# Patient Record
Sex: Female | Born: 1960 | Race: White | Hispanic: No | Marital: Single | Smoking: Never smoker
Health system: Southern US, Community
[De-identification: ages and names within clinical notes are randomized; demographics above are authoritative.]

## PROBLEM LIST (undated history)

## (undated) DIAGNOSIS — G40909 Epilepsy, unspecified, not intractable, without status epilepticus: Secondary | ICD-10-CM

## (undated) DIAGNOSIS — K5792 Diverticulitis of intestine, part unspecified, without perforation or abscess without bleeding: Secondary | ICD-10-CM

## (undated) DIAGNOSIS — R197 Diarrhea, unspecified: Secondary | ICD-10-CM

## (undated) DIAGNOSIS — E119 Type 2 diabetes mellitus without complications: Secondary | ICD-10-CM

## (undated) DIAGNOSIS — R1310 Dysphagia, unspecified: Secondary | ICD-10-CM

## (undated) DIAGNOSIS — F319 Bipolar disorder, unspecified: Secondary | ICD-10-CM

## (undated) DIAGNOSIS — K529 Noninfective gastroenteritis and colitis, unspecified: Secondary | ICD-10-CM

## (undated) DIAGNOSIS — K219 Gastro-esophageal reflux disease without esophagitis: Secondary | ICD-10-CM

## (undated) DIAGNOSIS — I10 Essential (primary) hypertension: Secondary | ICD-10-CM

## (undated) DIAGNOSIS — F919 Conduct disorder, unspecified: Secondary | ICD-10-CM

## (undated) DIAGNOSIS — F79 Unspecified intellectual disabilities: Secondary | ICD-10-CM

---

## 2019-08-05 ENCOUNTER — Emergency Department (HOSPITAL_COMMUNITY)
Admission: EM | Admit: 2019-08-05 | Discharge: 2019-08-05 | Disposition: A | Discharge status: Home / Self Care | Payer: Medicare Other | Attending: Emergency Medicine | Admitting: Emergency Medicine

## 2019-08-05 ENCOUNTER — Encounter (HOSPITAL_COMMUNITY): Payer: Self-pay | Admitting: Emergency Medicine

## 2019-08-05 ENCOUNTER — Emergency Department (HOSPITAL_COMMUNITY): Payer: Medicare Other

## 2019-08-05 ENCOUNTER — Other Ambulatory Visit: Payer: Self-pay

## 2019-08-05 DIAGNOSIS — F79 Unspecified intellectual disabilities: Secondary | ICD-10-CM | POA: Insufficient documentation

## 2019-08-05 DIAGNOSIS — Y92199 Unspecified place in other specified residential institution as the place of occurrence of the external cause: Secondary | ICD-10-CM | POA: Diagnosis not present

## 2019-08-05 DIAGNOSIS — I10 Essential (primary) hypertension: Secondary | ICD-10-CM | POA: Diagnosis not present

## 2019-08-05 DIAGNOSIS — W19XXXA Unspecified fall, initial encounter: Secondary | ICD-10-CM

## 2019-08-05 DIAGNOSIS — Y999 Unspecified external cause status: Secondary | ICD-10-CM | POA: Insufficient documentation

## 2019-08-05 DIAGNOSIS — S7001XA Contusion of right hip, initial encounter: Secondary | ICD-10-CM | POA: Insufficient documentation

## 2019-08-05 DIAGNOSIS — S79911A Unspecified injury of right hip, initial encounter: Secondary | ICD-10-CM | POA: Diagnosis present

## 2019-08-05 DIAGNOSIS — Z8669 Personal history of other diseases of the nervous system and sense organs: Secondary | ICD-10-CM | POA: Insufficient documentation

## 2019-08-05 DIAGNOSIS — W1830XA Fall on same level, unspecified, initial encounter: Secondary | ICD-10-CM | POA: Insufficient documentation

## 2019-08-05 DIAGNOSIS — F319 Bipolar disorder, unspecified: Secondary | ICD-10-CM | POA: Insufficient documentation

## 2019-08-05 DIAGNOSIS — E119 Type 2 diabetes mellitus without complications: Secondary | ICD-10-CM | POA: Insufficient documentation

## 2019-08-05 DIAGNOSIS — R2689 Other abnormalities of gait and mobility: Secondary | ICD-10-CM | POA: Insufficient documentation

## 2019-08-05 DIAGNOSIS — Y939 Activity, unspecified: Secondary | ICD-10-CM | POA: Insufficient documentation

## 2019-08-05 HISTORY — DX: Unspecified intellectual disabilities: F79

## 2019-08-05 HISTORY — DX: Noninfective gastroenteritis and colitis, unspecified: K52.9

## 2019-08-05 HISTORY — DX: Conduct disorder, unspecified: F91.9

## 2019-08-05 HISTORY — DX: Diverticulitis of intestine, part unspecified, without perforation or abscess without bleeding: K57.92

## 2019-08-05 HISTORY — DX: Type 2 diabetes mellitus without complications: E11.9

## 2019-08-05 HISTORY — DX: Essential (primary) hypertension: I10

## 2019-08-05 HISTORY — DX: Epilepsy, unspecified, not intractable, without status epilepticus: G40.909

## 2019-08-05 HISTORY — DX: Bipolar disorder, unspecified: F31.9

## 2019-08-05 HISTORY — DX: Dysphagia, unspecified: R13.10

## 2019-08-05 HISTORY — DX: Gastro-esophageal reflux disease without esophagitis: K21.9

## 2019-08-05 HISTORY — DX: Diarrhea, unspecified: R19.7

## 2019-08-05 LAB — CBC WITH DIFFERENTIAL/PLATELET
Abs Immature Granulocytes: 0.02 10*3/uL (ref 0.00–0.07)
Basophils Absolute: 0 10*3/uL (ref 0.0–0.1)
Basophils Relative: 1 %
Eosinophils Absolute: 0 10*3/uL (ref 0.0–0.5)
Eosinophils Relative: 0 %
HCT: 35.6 % — ABNORMAL LOW (ref 36.0–46.0)
Hemoglobin: 11.9 g/dL — ABNORMAL LOW (ref 12.0–15.0)
Immature Granulocytes: 1 %
Lymphocytes Relative: 31 %
Lymphs Abs: 1.4 10*3/uL (ref 0.7–4.0)
MCH: 33.2 pg (ref 26.0–34.0)
MCHC: 33.4 g/dL (ref 30.0–36.0)
MCV: 99.4 fL (ref 80.0–100.0)
Monocytes Absolute: 0.5 10*3/uL (ref 0.1–1.0)
Monocytes Relative: 11 %
Neutro Abs: 2.5 10*3/uL (ref 1.7–7.7)
Neutrophils Relative %: 56 %
Platelets: 192 10*3/uL (ref 150–400)
RBC: 3.58 MIL/uL — ABNORMAL LOW (ref 3.87–5.11)
RDW: 14.9 % (ref 11.5–15.5)
WBC: 4.3 10*3/uL (ref 4.0–10.5)
nRBC: 0 % (ref 0.0–0.2)

## 2019-08-05 LAB — BASIC METABOLIC PANEL
Anion gap: 9 (ref 5–15)
BUN: 18 mg/dL (ref 6–20)
CO2: 28 mmol/L (ref 22–32)
Calcium: 9.3 mg/dL (ref 8.9–10.3)
Chloride: 103 mmol/L (ref 98–111)
Creatinine, Ser: 0.68 mg/dL (ref 0.44–1.00)
GFR calc Af Amer: >60 mL/min (ref >60)
GFR calc non Af Amer: >60 mL/min (ref >60)
Glucose, Bld: 75 mg/dL (ref 70–99)
Potassium: 4.6 mmol/L (ref 3.5–5.1)
Sodium: 140 mmol/L (ref 135–145)

## 2019-08-05 NOTE — Discharge Instructions (Addendum)
Return here as needed.  Tylenol and Motrin for any pain.  Follow-up with her primary care doctor.  Ice to the area on the right hip.

## 2019-08-05 NOTE — ED Provider Notes (Signed)
[facility] EMERGENCY DEPARTMENT Provider Note   CSN: [id number] Arrival date & time: 08/05/19  1441     History   Chief Complaint Chief Complaint  Patient presents with   Fall   Hip Pain    HPI Emma Murphy is a 58 y.o. female.     HPI Patient presents to the emergency department with injuries following a fall 2 weeks ago.  The patient had a fall and then refill yesterday.  The patient has had some recent medication adjustments as well.  She is in a group home due to mental retardation.  Patient is unable to give any history.  The staff member and caregiver state that she has a bruise to the right hip and they were concerned about this.  They feel like she is having some trouble walking due to this.  Patient has had no vomiting, fevers, cough or syncope. Past Medical History:  Diagnosis Date   Bipolar 1 disorder (HCC)    Conduct disorder    Diabetes mellitus without complication (HCC)    Diarrhea    Diverticulitis    Dysphagia    Epilepsy (HCC)    Gastroenteritis    GERD (gastroesophageal reflux disease)    Hypertension    Intellectual disability     There are no active problems to display for this patient.   History reviewed. No pertinent surgical history.   OB History   No obstetric history on file.      Home Medications    Prior to Admission medications   Not on File    Family History No family history on file.  Social History Social History   Tobacco Use   Smoking status: Never Smoker   Smokeless tobacco: Never Used  Substance Use Topics   Alcohol use: Not Currently   Drug use: Not Currently     Allergies   Patient has no allergy information on record.   Review of Systems Review of Systems Level 5 caveat applies due to mental retardation  Physical Exam Updated Vital Signs BP (!) 127/99    Pulse 65    Temp 99.3 F (37.4 C) (Oral)    Resp 20    SpO2 91%   Physical Exam Vitals signs and nursing  note reviewed.  Constitutional:      General: She is not in acute distress.    Appearance: She is well-developed.  HENT:     Head: Normocephalic and atraumatic.  Eyes:     Pupils: Pupils are equal, round, and reactive to light.  Neck:     Musculoskeletal: Normal range of motion and neck supple.  Cardiovascular:     Rate and Rhythm: Normal rate and regular rhythm.     Heart sounds: Normal heart sounds. No murmur. No friction rub. No gallop.   Pulmonary:     Effort: Pulmonary effort is normal. No respiratory distress.     Breath sounds: Normal breath sounds. No wheezing.  Abdominal:     General: Bowel sounds are normal. There is no distension.     Palpations: Abdomen is soft.     Tenderness: There is no abdominal tenderness.  Skin:    General: Skin is warm and dry.     Capillary Refill: Capillary refill takes less than 2 seconds.     Findings: No erythema or rash.  Neurological:     Mental Status: She is alert and oriented to person, place, and time.     Motor: No abnormal muscle  tone.     Coordination: Coordination normal.  Psychiatric:        Behavior: Behavior normal.      ED Treatments / Results  Labs (all labs ordered are listed, but only abnormal results are displayed) Labs Reviewed  CBC WITH DIFFERENTIAL/PLATELET - Abnormal; Notable for the following components:      Result Value   RBC 3.58 (*)    Hemoglobin 11.9 (*)    HCT 35.6 (*)    All other components within normal limits  BASIC METABOLIC PANEL    EKG None  Radiology Dg Chest 2 View  Result Date: 08/05/2019 CLINICAL DATA:  Cough EXAM: CHEST - 2 VIEW COMPARISON:  None. FINDINGS: The heart size and mediastinal contours are within normal limits. Both lungs are clear. Scoliosis and degenerative changes of the spine. IMPRESSION: No active cardiopulmonary disease. Electronically Signed   By: Jasmine PangKim  Fujinaga M.D.   On: 08/05/2019 17:51   Ct Head Wo Contrast  Result Date: 08/05/2019 CLINICAL DATA:  Altered  level of consciousness, fall 2 weeks prior, unsteady gait EXAM: CT HEAD WITHOUT CONTRAST TECHNIQUE: Contiguous axial images were obtained from the base of the skull through the vertex without intravenous contrast. COMPARISON:  None. FINDINGS: Brain: There is slight motion degradation most pronounced towards the skull base partially obscuring the posterior fossa. No evidence of acute infarction, hemorrhage, hydrocephalus, extra-axial collection or mass lesion/mass effect. Vascular: Atherosclerotic calcification of the carotid siphons. No hyperdense vessel. Skull: No calvarial fracture or suspicious osseous lesion. No scalp swelling or hematoma. Sinuses/Orbits: Paranasal sinuses and mastoid air cells are predominantly clear. Included orbital structures are unremarkable. Other: None IMPRESSION: No acute intracranial abnormality. Streak artifact limits evaluation of the skull base and posterior fossa. Electronically Signed   By: Kreg ShropshirePrice  DeHay M.D.   On: 08/05/2019 18:21   Ct Hip Right Wo Contrast  Result Date: 08/05/2019 CLINICAL DATA:  Right hip pain secondary to a fall 2 weeks ago. Bruising. EXAM: CT OF THE RIGHT HIP WITHOUT CONTRAST TECHNIQUE: Multidetector CT imaging of the right hip was performed according to the standard protocol. Multiplanar CT image reconstructions were also generated. COMPARISON:  Hip radiographs dated 08/05/2019 FINDINGS: Bones/Joint/Cartilage There is no fracture or dislocation. There is moderately severe arthritis of the right hip joint with marginal osteophyte formation and cystic degenerative changes in the femoral head. No joint effusion. Muscles and Tendons No acute abnormalities. Soft tissues There is a 7.0 x 3.6 x 2.7 cm hematoma in the subcutaneous fat just lateral to the left greater trochanter with soft tissue stranding around the hematoma consistent with bruising. There is also ill-defined soft tissue stranding adjacent to the posterior aspect of the left ischial tuberosity  which could represent soft tissue contusion. IMPRESSION: 1. No acute osseous abnormality of the right hip. 2. Subcutaneous hematoma just lateral to the left greater trochanter. 3. Moderately severe arthritis of the right hip joint. Electronically Signed   By: Francene BoyersJames  Maxwell M.D.   On: 08/05/2019 18:24   Dg Hip Unilat  With Pelvis 2-3 Views Right  Result Date: 08/05/2019 CLINICAL DATA:  58 year old female with fall and right hip pain. EXAM: DG HIP (WITH OR WITHOUT PELVIS) 2-3V RIGHT COMPARISON:  None. FINDINGS: There is no acute fracture or dislocation. The bones are osteopenic. Mild bilateral hip arthritic changes. Soft tissue contusion noted over the right hip. IMPRESSION: 1. No acute fracture or dislocation. 2. Soft tissue contusion over the right hip. Electronically Signed   By: Ceasar MonsArash  Radparvar M.D.  On: 08/05/2019 15:38    Procedures Procedures (including critical care time)  Medications Ordered in ED Medications - No data to display   Initial Impression / Assessment and Plan / ED Course  I have reviewed the triage vital signs and the nursing notes.  Pertinent labs & imaging results that were available during my care of the patient were reviewed by me and considered in my medical decision making (see chart for details).      Patient will be advised to follow-up with her primary doctor.  The CT scan of the head and hips did not show any significant abnormality.  Patient's x-rays her chest did not show any abnormality.  Final Clinical Impressions(s) / ED Diagnoses   Final diagnoses:  None    ED Discharge Orders    None       Dalia Heading, PA-C 08/05/19 1956    Isla Pence, MD 08/05/19 2023

## 2019-08-05 NOTE — ED Triage Notes (Signed)
Pt to ED with caregiver from group home.  Pt fell 2 weeks ago and has R hip pain with bruise. Caregiver reports unsteady gait.

## 2019-08-05 NOTE — ED Notes (Signed)
Patient Alert and oriented to baseline. Stable and ambulatory to baseline. Patient verbalized understanding of the discharge instructions.  Patient belongings were taken by the patient.   

## 2019-10-05 ENCOUNTER — Other Ambulatory Visit: Payer: Self-pay

## 2019-10-05 ENCOUNTER — Emergency Department (HOSPITAL_COMMUNITY)
Admission: EM | Admit: 2019-10-05 | Discharge: 2019-10-05 | Disposition: A | Discharge status: Home / Self Care | Payer: Medicare Other | Attending: Emergency Medicine | Admitting: Emergency Medicine

## 2019-10-05 ENCOUNTER — Encounter (HOSPITAL_COMMUNITY): Payer: Self-pay | Admitting: Emergency Medicine

## 2019-10-05 DIAGNOSIS — E119 Type 2 diabetes mellitus without complications: Secondary | ICD-10-CM | POA: Diagnosis not present

## 2019-10-05 DIAGNOSIS — E870 Hyperosmolality and hypernatremia: Secondary | ICD-10-CM | POA: Insufficient documentation

## 2019-10-05 DIAGNOSIS — D696 Thrombocytopenia, unspecified: Secondary | ICD-10-CM | POA: Insufficient documentation

## 2019-10-05 DIAGNOSIS — I10 Essential (primary) hypertension: Secondary | ICD-10-CM | POA: Insufficient documentation

## 2019-10-05 DIAGNOSIS — E878 Other disorders of electrolyte and fluid balance, not elsewhere classified: Secondary | ICD-10-CM | POA: Diagnosis not present

## 2019-10-05 DIAGNOSIS — E860 Dehydration: Secondary | ICD-10-CM | POA: Diagnosis not present

## 2019-10-05 DIAGNOSIS — N390 Urinary tract infection, site not specified: Secondary | ICD-10-CM | POA: Diagnosis not present

## 2019-10-05 DIAGNOSIS — R7989 Other specified abnormal findings of blood chemistry: Secondary | ICD-10-CM

## 2019-10-05 DIAGNOSIS — R531 Weakness: Secondary | ICD-10-CM | POA: Diagnosis present

## 2019-10-05 LAB — CBC WITH DIFFERENTIAL/PLATELET
Abs Immature Granulocytes: 0.02 10*3/uL (ref 0.00–0.07)
Basophils Absolute: 0 10*3/uL (ref 0.0–0.1)
Basophils Relative: 1 %
Eosinophils Absolute: 0.1 10*3/uL (ref 0.0–0.5)
Eosinophils Relative: 1 %
HCT: 36.1 % (ref 36.0–46.0)
Hemoglobin: 11.4 g/dL — ABNORMAL LOW (ref 12.0–15.0)
Immature Granulocytes: 1 %
Lymphocytes Relative: 18 %
Lymphs Abs: 0.7 10*3/uL (ref 0.7–4.0)
MCH: 33.8 pg (ref 26.0–34.0)
MCHC: 31.6 g/dL (ref 30.0–36.0)
MCV: 107.1 fL — ABNORMAL HIGH (ref 80.0–100.0)
Monocytes Absolute: 0.5 10*3/uL (ref 0.1–1.0)
Monocytes Relative: 14 %
Neutro Abs: 2.5 10*3/uL (ref 1.7–7.7)
Neutrophils Relative %: 65 %
Platelets: 70 10*3/uL — ABNORMAL LOW (ref 150–400)
RBC: 3.37 MIL/uL — ABNORMAL LOW (ref 3.87–5.11)
RDW: 16.5 % — ABNORMAL HIGH (ref 11.5–15.5)
WBC Morphology: INCREASED
WBC: 3.8 10*3/uL — ABNORMAL LOW (ref 4.0–10.5)
nRBC: 0 % (ref 0.0–0.2)

## 2019-10-05 LAB — BASIC METABOLIC PANEL
Anion gap: 6 (ref 5–15)
BUN: 48 mg/dL — ABNORMAL HIGH (ref 6–20)
CO2: 35 mmol/L — ABNORMAL HIGH (ref 22–32)
Calcium: 9.4 mg/dL (ref 8.9–10.3)
Chloride: 115 mmol/L — ABNORMAL HIGH (ref 98–111)
Creatinine, Ser: 0.92 mg/dL (ref 0.44–1.00)
GFR calc Af Amer: >60 mL/min (ref >60)
GFR calc non Af Amer: >60 mL/min (ref >60)
Glucose, Bld: 156 mg/dL — ABNORMAL HIGH (ref 70–99)
Potassium: 4.8 mmol/L (ref 3.5–5.1)
Sodium: 156 mmol/L — ABNORMAL HIGH (ref 135–145)

## 2019-10-05 LAB — URINALYSIS, ROUTINE W REFLEX MICROSCOPIC
Bilirubin Urine: NEGATIVE
Glucose, UA: NEGATIVE mg/dL
Hgb urine dipstick: NEGATIVE
Ketones, ur: 5 mg/dL — AB
Nitrite: NEGATIVE
Protein, ur: NEGATIVE mg/dL
Specific Gravity, Urine: 1.028 (ref 1.005–1.030)
pH: 5 (ref 5.0–8.0)

## 2019-10-05 MED ORDER — CEPHALEXIN 250 MG PO CAPS: 250.0000 mg | ORAL_CAPSULE | Freq: Four times a day (QID) | ORAL | 0 refills | Status: AC

## 2019-10-05 NOTE — ED Notes (Signed)
Patient's nurse verbalizes understanding of discharge instructions. Opportunity for questioning and answers were provided. Armband removed by staff, pt discharged from ED.    Nurse Victorino Dike) requesting paperwork and prescription be faxed to her specifically, informed nurse we cannot fax paper prescription but would fax d/c instructions to her.

## 2019-10-05 NOTE — ED Provider Notes (Signed)
[facility] EMERGENCY DEPARTMENT Provider Note   CSN: [id number] Arrival date & time: 10/05/19  1107     History No chief complaint on file.   Emma Murphy is a 59 y.o. female.  HPI   Patient brought in by EMS, from a group home where she was sleeping on the floor this morning, as usual.  Caregivers there felt like she was less energetic this morning than usual.  No other noted symptoms.  No intervention by EMS during transport.  Patient cannot give any history.  Level 5 caveat-altered mental status     Past Medical History:  Diagnosis Date  . Bipolar 1 disorder (HCC)   . Conduct disorder   . Diabetes mellitus without complication (HCC)   . Diarrhea   . Diverticulitis   . Dysphagia   . Epilepsy (HCC)   . Gastroenteritis   . GERD (gastroesophageal reflux disease)   . Hypertension   . Intellectual disability     There are no problems to display for this patient.   History reviewed. No pertinent surgical history.   OB History   No obstetric history on file.     History reviewed. No pertinent family history.  Social History   Tobacco Use  . Smoking status: Never Smoker  . Smokeless tobacco: Never Used  Substance Use Topics  . Alcohol use: Not Currently  . Drug use: Not Currently    Home Medications Prior to Admission medications   Medication Sig Start Date End Date Taking? Authorizing Provider  cephALEXin (KEFLEX) 250 MG capsule Take 1 capsule (250 mg total) by mouth 4 (four) times daily. 10/05/19   Mancel Bale, MD    Allergies    Patient has no known allergies.  Review of Systems   Review of Systems  Unable to perform ROS: Mental status change    Physical Exam Updated Vital Signs BP 140/86   Pulse 88   Temp (!) 97 F (36.1 C) (Axillary)   Resp 16   SpO2 98%   Physical Exam Vitals and nursing note reviewed.  Constitutional:      General: She is not in acute distress.    Appearance: She is well-developed. She is  not ill-appearing, toxic-appearing or diaphoretic.     Comments: She appears chronically malnourished and older than stated age.  HENT:     Head: Normocephalic and atraumatic.     Nose: No congestion.  Eyes:     Conjunctiva/sclera: Conjunctivae normal.     Pupils: Pupils are equal, round, and reactive to light.  Neck:     Trachea: Phonation normal.  Cardiovascular:     Rate and Rhythm: Normal rate and regular rhythm.  Pulmonary:     Effort: Pulmonary effort is normal.     Breath sounds: Normal breath sounds.  Chest:     Chest wall: No tenderness.  Abdominal:     General: There is no distension.     Palpations: Abdomen is soft.     Tenderness: There is no abdominal tenderness. There is no guarding.  Musculoskeletal:        General: Normal range of motion.     Cervical back: Normal range of motion and neck supple.     Comments: Ecchymosis with swelling, left volar forearm distally.  No break in skin at this focus.  No other large joint abnormality.  Skin:    General: Skin is warm and dry.  Neurological:     Mental Status: She is alert. She  is disoriented.     Cranial Nerves: No cranial nerve deficit.     Motor: No abnormal muscle tone.     Comments: Cooperative during exam, she is not verbal, and response to questions.  Mild spasticity upper arms bilaterally.  No focal asymmetry of strength.  Psychiatric:        Mood and Affect: Mood normal.        Behavior: Behavior normal.     ED Results / Procedures / Treatments   Labs (all labs ordered are listed, but only abnormal results are displayed) Labs Reviewed  URINALYSIS, ROUTINE W REFLEX MICROSCOPIC - Abnormal; Notable for the following components:      Result Value   Color, Urine AMBER (*)    APPearance HAZY (*)    Ketones, ur 5 (*)    Leukocytes,Ua TRACE (*)    Bacteria, UA MANY (*)    All other components within normal limits  BASIC METABOLIC PANEL - Abnormal; Notable for the following components:   Sodium 156 (*)     Chloride 115 (*)    CO2 35 (*)    Glucose, Bld 156 (*)    BUN 48 (*)    All other components within normal limits  CBC WITH DIFFERENTIAL/PLATELET - Abnormal; Notable for the following components:   WBC 3.8 (*)    RBC 3.37 (*)    Hemoglobin 11.4 (*)    MCV 107.1 (*)    RDW 16.5 (*)    Platelets 70 (*)    All other components within normal limits  URINE CULTURE    EKG None  Radiology No results found.  Procedures Procedures (including critical care time)  Medications Ordered in ED Medications - No data to display  ED Course  I have reviewed the triage vital signs and the nursing notes.  Pertinent labs & imaging results that were available during my care of the patient were reviewed by me and considered in my medical decision making (see chart for details).  Clinical Course as of Oct 05 1415  Mon Oct 05, 2019  1353 I was able to reach Emma Murphy, the patient's daughter.  She states that she is the patient's "guardian."  She states that her caregivers are concerned that the patient   [EW]  1402 Normal except sodium high, chloride high, CO2 high, glucose high, BUN high  Basic metabolic panel(!) [EW]  9381 Abnormal, presence of ketones, leukocytes, increased white cells and many bacteria.  Urine culture sent  Urinalysis, Routine w reflex microscopic(!) [EW]  1403 Abnormal, white count low, hemoglobin low, MCV high, platelets low  CBC with Differential(!) [EW]  1403 I had a conversation with the patient's nurse, her name is Emma Murphy.  She takes care of the patient at her group home.  She has some labs on the patient recently which sounds similar to those taken today.  Because of these findings, the patient was referred to a gastroenterologist but that has not yet been done.  Anderson Malta affirms that the patient is losing weight, gradually, despite seeming to be able to eat and drink fairly normally.  She does require thickened foods and liquid because of prior episodes of  choking.  Anderson Malta confirms the following list of medications: Acidophilus 1 daily, benztropine 0.5 mg twice daily, Zyrtec 10 mg daily, clonidine 0.1 mg twice daily, Depakote 500 mg twice daily, Ensure twice daily, melatonin 3 mg at bedtime, multivitamin, 1 in the morning.  Protonix 40 mg every morning, trazodone 200 mg nightly  and Ambien 10 mg at bedtime.   [EW]  1408 Monocyte #: 0.5 [EW]    Clinical Course User Index [EW] Mancel Bale, MD   MDM Rules/Calculators/A&P                       Patient Vitals for the past 24 hrs:  BP Temp Temp src Pulse Resp SpO2  10/05/19 1315 140/86 -- -- -- -- --  10/05/19 1215 (!) 100/56 -- -- 88 16 98 %  10/05/19 1130 -- -- -- 100 -- 97 %  10/05/19 1124 (!) 154/136 -- -- -- -- --  10/05/19 1117 -- (!) 97 F (36.1 C) Axillary 60 17 99 %    2:05 PM Reevaluation with update and discussion. After initial assessment and treatment, an updated evaluation reveals patient has been able to drink fluids here hent eat some crackers.  Findings have been discussed with the patient's guardian, Emma Murphy, and her nurse, Emma Murphy.  They have staff that can pick the patient up and take her home.  All questions were answered. Mancel Bale   Medical Decision Making: Patient with malaise, associated with altered intake, and gradual weight loss.  This is likely multifactorial.  Findings consistent with dehydration associated with elevated BUN and sodium.  Possible UTI.  Doubt sepsis or metabolic instability.  Nonspecific thrombocytopenia.  As well as mildly low hemoglobin.  She is on multiple medications.  She gets regular care from her facility physician and does have a PCP at [facility].  There is no indication for hospitalization or further intervention ED at this time.  Caregivers understand the patient needs to drink more liquid I have recommended 1 to 2 L of water each day.  She is continue her usual medications and supplements at this  time.   Emma Murphy was evaluated in Emergency Department on 10/05/2019 for the symptoms described in the history of present illness. She was evaluated in the context of the global COVID-19 pandemic, which necessitated consideration that the patient might be at risk for infection with the SARS-CoV-2 virus that causes COVID-19. Institutional protocols and algorithms that pertain to the evaluation of patients at risk for COVID-19 are in a state of rapid change based on information released by regulatory bodies including the CDC and federal and state organizations. These policies and algorithms were followed during the patient's care in the ED.    CRITICAL CARE-no Performed by: Mancel Bale   Nursing Notes Reviewed/ Care Coordinated Applicable Imaging Reviewed Interpretation of Laboratory Data incorporated into ED treatment  The patient appears reasonably screened and/or stabilized for discharge and I doubt any other medical condition or other [facility] requiring further screening, evaluation, or treatment in the ED at this time prior to discharge.  Plan: Home Medications-continue usual medications; Home Treatments-increase oral fluid intake by 1 to 2 L of water each day; return here if the recommended treatment, does not improve the symptoms; Recommended follow up-PCP follow-up for further care as soon as possible.   Final Clinical Impression(s) / ED Diagnoses Final diagnoses:  Urinary tract infection without hematuria, site unspecified  Dehydration  Hypernatremia  Azotemia  Thrombocytopenia (HCC)  Hyperchloremia    Rx / DC Orders ED Discharge Orders         Ordered    cephALEXin (KEFLEX) 250 MG capsule  4 times daily     10/05/19 1412           Mancel Bale, MD 10/05/19  1418  

## 2019-10-05 NOTE — ED Notes (Addendum)
Staff found pt lying on the floor this AM.  Bruising and swelling to left wrist. Pt cooperative and calm upon arrival.

## 2019-10-05 NOTE — Discharge Instructions (Addendum)
Blood testing today shows dehydration with elevated sodium and chloride and BUN.  These will tend to improve by increasing free water intake by 1 to 2 L each day.  This will have to be done with thickening.  Since she is losing weight, it will be important for her to follow-up with her primary care doctor at the facility and possibly her regular PCP, as well.  She may have a urinary tract infection we are starting her on antibiotic.  We sent a urinary culture which will be back in a couple of days which will confirm the infection.  Start the antibiotic as soon as possible.  She has other blood abnormalities including low white count, low hemoglobin, and low platelets that will also need outpatient follow-up.

## 2019-10-05 NOTE — ED Notes (Signed)
Pt given water and graham crackers 

## 2019-10-05 NOTE — ED Triage Notes (Signed)
Pt is here from [facility] health services with c/o weakness , history of MR , non verbal, vss , group home wants pt checked out

## 2019-10-07 ENCOUNTER — Ambulatory Visit: Payer: Medicare Other | Admitting: Physician Assistant

## 2019-10-07 LAB — URINE CULTURE
Culture: 80000 — AB
Special Requests: NORMAL

## 2019-10-14 ENCOUNTER — Ambulatory Visit: Payer: Medicare Other | Admitting: Gastroenterology

## 2019-10-14 MED ORDER — DEXTROSE 10 % IV SOLN
125.00 | INTRAVENOUS | Status: DC
Start: ? — End: 2019-10-14

## 2019-10-14 MED ORDER — GENERIC EXTERNAL MEDICATION
1.00 | Status: DC
Start: 2019-10-14 — End: 2019-10-14

## 2019-10-14 MED ORDER — PANTOPRAZOLE SODIUM 40 MG IV SOLR
40.00 | INTRAVENOUS | Status: DC
Start: 2019-10-14 — End: 2019-10-14

## 2019-10-14 MED ORDER — GENERIC EXTERNAL MEDICATION
Status: DC
Start: ? — End: 2019-10-14

## 2019-10-14 MED ORDER — GLUCOSE 40 % PO GEL
15.00 | ORAL | Status: DC
Start: ? — End: 2019-10-14

## 2019-10-14 MED ORDER — BENZTROPINE MESYLATE 1 MG PO TABS
0.50 | ORAL_TABLET | ORAL | Status: DC
Start: 2019-10-14 — End: 2019-10-14

## 2019-10-14 MED ORDER — GLUCAGON (RDNA) 1 MG IJ KIT
1.00 | PACK | INTRAMUSCULAR | Status: DC
Start: ? — End: 2019-10-14

## 2019-10-14 MED ORDER — INSULIN LISPRO 100 UNIT/ML ~~LOC~~ SOLN
0.00 | SUBCUTANEOUS | Status: DC
Start: 2019-10-14 — End: 2019-10-14

## 2019-10-14 MED ORDER — VALPROIC ACID 250 MG/5ML PO SOLN
300.00 | ORAL | Status: DC
Start: 2019-10-14 — End: 2019-10-14

## 2019-10-14 MED ORDER — ALBUTEROL SULFATE (2.5 MG/3ML) 0.083% IN NEBU
2.50 | INHALATION_SOLUTION | RESPIRATORY_TRACT | Status: DC
Start: ? — End: 2019-10-14

## 2019-10-14 MED ORDER — PROPOFOL 100 MG/10ML IV EMUL
0.00 | INTRAVENOUS | Status: DC
Start: ? — End: 2019-10-14

## 2019-10-14 MED ORDER — DEXAMETHASONE SODIUM PHOSPHATE 4 MG/ML IJ SOLN
5.00 | INTRAMUSCULAR | Status: DC
Start: 2019-10-14 — End: 2019-10-14

## 2019-10-14 MED ORDER — GENERIC EXTERNAL MEDICATION
0.00 | Status: DC
Start: ? — End: 2019-10-14

## 2019-10-14 MED ORDER — FENTANYL CITRATE (PF) 50 MCG/ML IJ SOLN
25.00 | INTRAMUSCULAR | Status: DC
Start: ? — End: 2019-10-14

## 2019-10-21 MED ORDER — FAMOTIDINE 20 MG PO TABS
20.00 | ORAL_TABLET | ORAL | Status: DC
Start: 2019-10-21 — End: 2019-10-21

## 2019-10-21 MED ORDER — ONDANSETRON HCL 4 MG/2ML IJ SOLN
4.00 | INTRAMUSCULAR | Status: DC
Start: ? — End: 2019-10-21

## 2019-10-21 MED ORDER — MELATONIN 3 MG PO TABS
3.00 | ORAL_TABLET | ORAL | Status: DC
Start: ? — End: 2019-10-21

## 2019-10-21 MED ORDER — LORAZEPAM 2 MG/ML IJ SOLN
0.50 | INTRAMUSCULAR | Status: DC
Start: ? — End: 2019-10-21

## 2019-10-21 MED ORDER — GENERIC EXTERNAL MEDICATION
2.00 | Status: DC
Start: ? — End: 2019-10-21

## 2019-10-21 MED ORDER — MORPHINE SULFATE 2 MG/ML IJ SOLN
1.00 | INTRAMUSCULAR | Status: DC
Start: ? — End: 2019-10-21

## 2019-10-21 MED ORDER — RA PROBIOTIC DIGESTIVE CARE PO CAPS
1.00 | ORAL_CAPSULE | ORAL | Status: DC
Start: 2019-10-22 — End: 2019-10-21

## 2019-10-21 MED ORDER — POLYETHYLENE GLYCOL 3350 17 GM/SCOOP PO POWD
17.00 | ORAL | Status: DC
Start: ? — End: 2019-10-21

## 2019-10-21 MED ORDER — LOPERAMIDE HCL 1 MG/7.5ML PO LIQD
2.00 | ORAL | Status: DC
Start: ? — End: 2019-10-21

## 2019-10-21 MED ORDER — POLYVINYL ALCOHOL 1.4 % OP SOLN
1.00 | OPHTHALMIC | Status: DC
Start: ? — End: 2019-10-21

## 2019-10-21 MED ORDER — DIVALPROEX SODIUM 125 MG PO CSDR
500.00 | DELAYED_RELEASE_CAPSULE | ORAL | Status: DC
Start: 2019-10-21 — End: 2019-10-21

## 2019-10-21 MED ORDER — GLYCOPYRROLATE 0.4 MG/2ML IJ SOLN
0.10 | INTRAMUSCULAR | Status: DC
Start: ? — End: 2019-10-21

## 2019-11-16 DEATH — deceased

## 2021-09-26 IMAGING — CT CT HIP*R* W/O CM
2 of 3 series · 17 of 46 positions shown, 19 images · non-contrast
Comparison: Hip radiographs dated 08/05/2019

CLINICAL DATA: Right hip pain secondary to a fall 2 weeks ago.
Bruising.

EXAM:
CT OF THE RIGHT HIP WITHOUT CONTRAST
TECHNIQUE: Multidetector CT imaging of the right hip was performed according to
the standard protocol. Multiplanar CT image reconstructions were
also generated.

[Series 5: hip 2.0 st · axial · 0.50mm/px · z∈[-880,-664]mm · 14 of 126 slices shown, 16 images]
[im 9/126  soft-tissue]
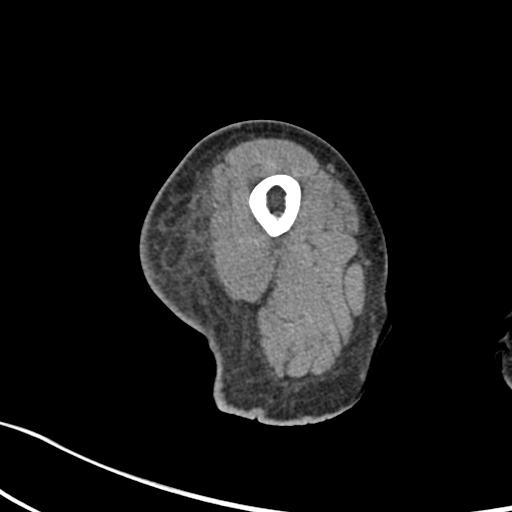
[im 9/126  bone]
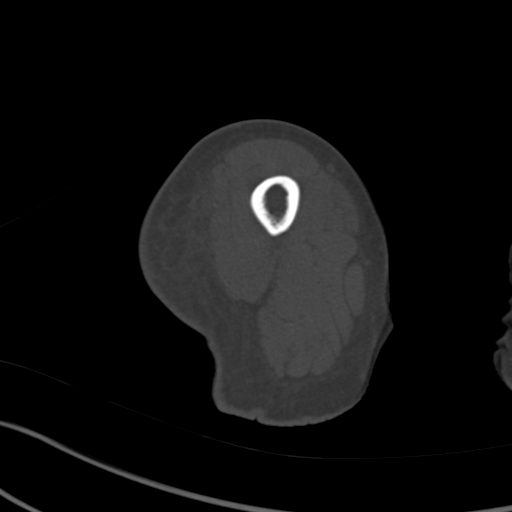
[im 17/126  soft-tissue]
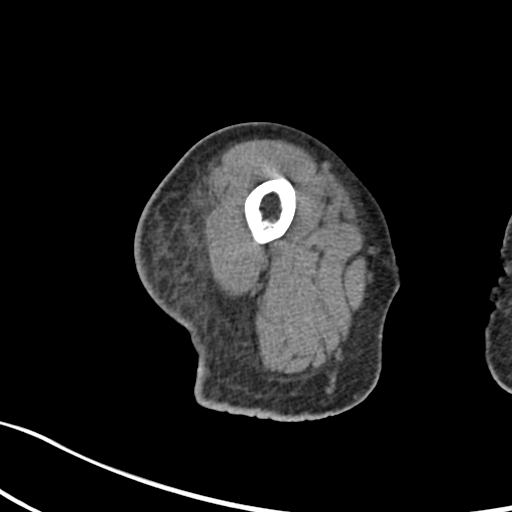
[im 25/126  soft-tissue]
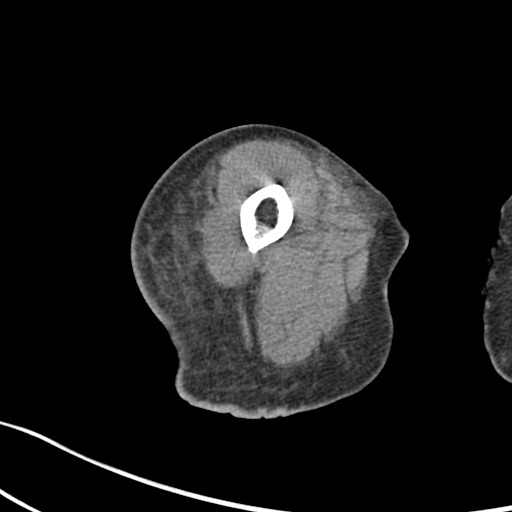
[im 33/126  soft-tissue]
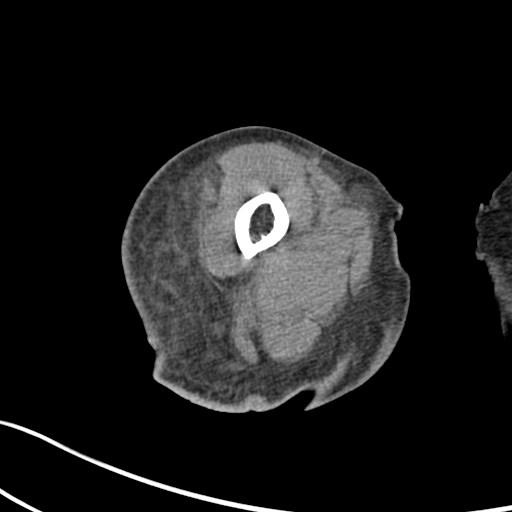
[im 41/126  soft-tissue]
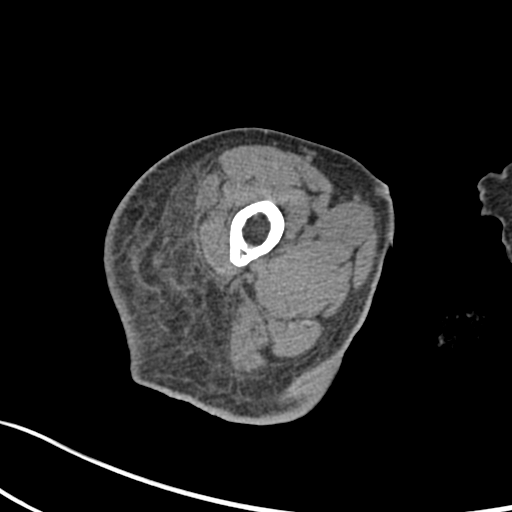
[im 49/126  soft-tissue]
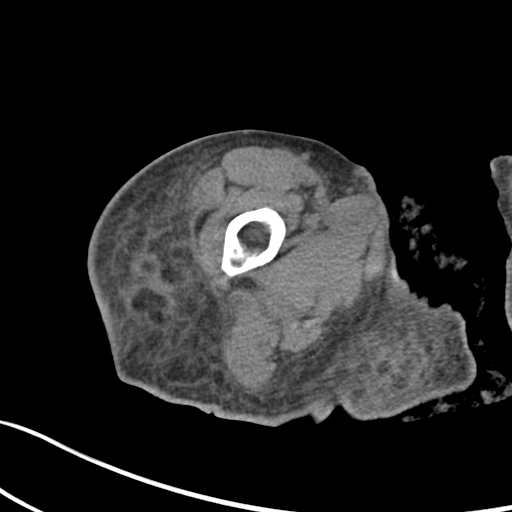
[im 57/126  soft-tissue]
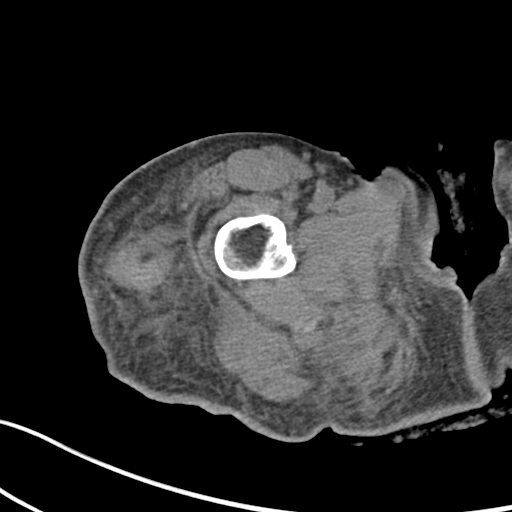
[im 69/126  soft-tissue]
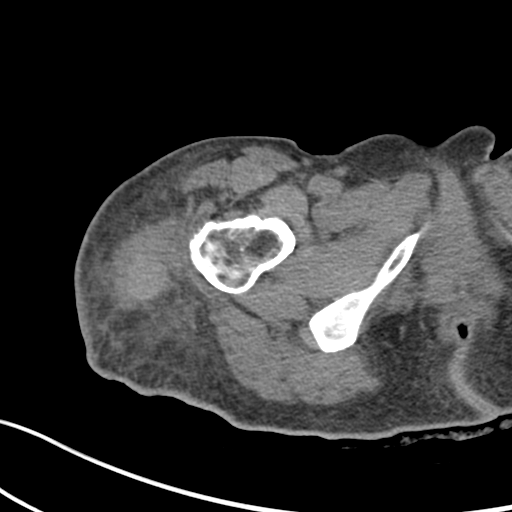
[im 77/126  soft-tissue]
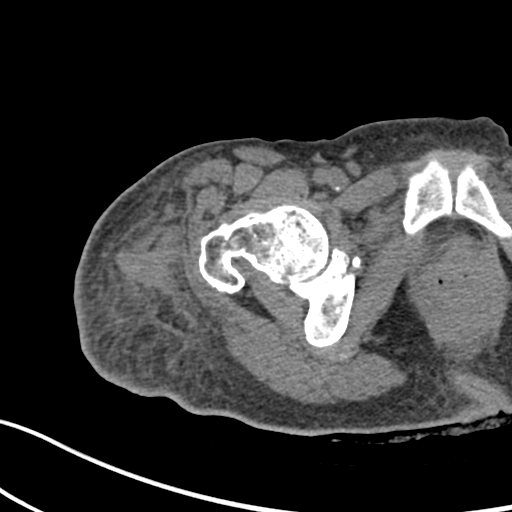
[im 77/126  bone]
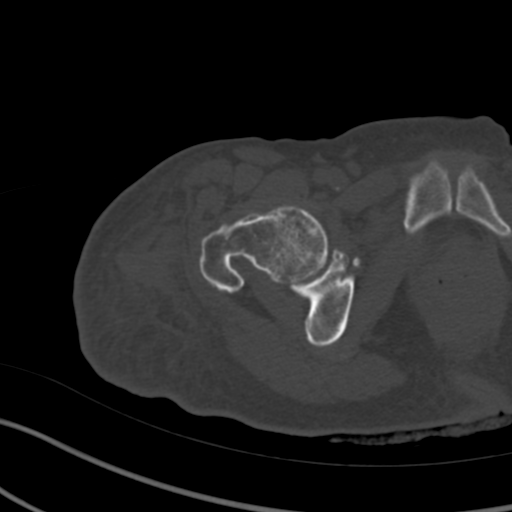
[im 85/126  soft-tissue]
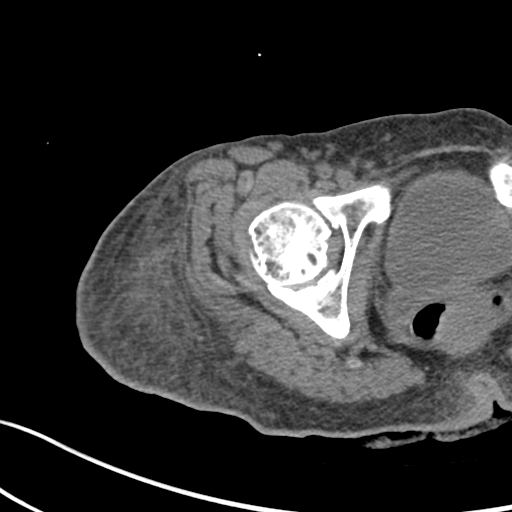
[im 93/126  soft-tissue]
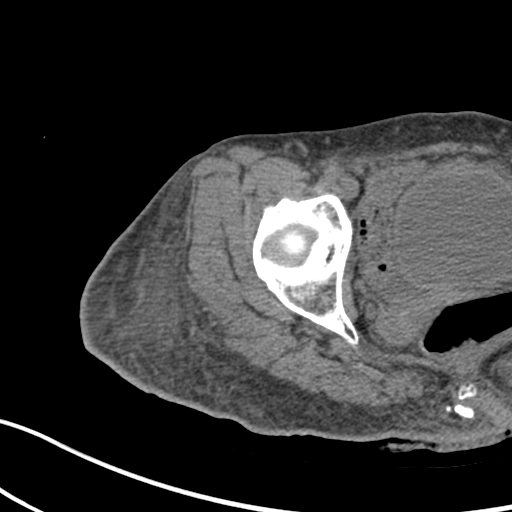
[im 101/126  soft-tissue]
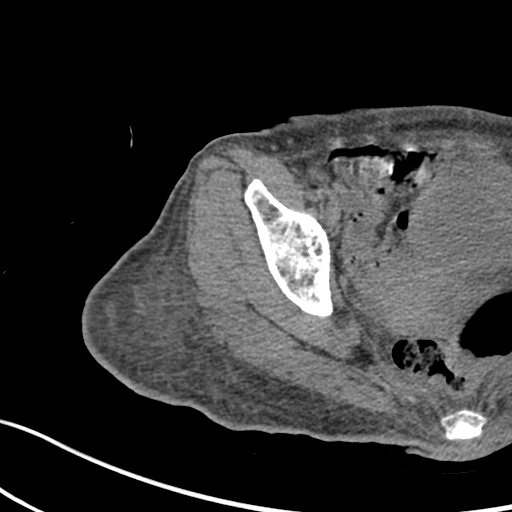
[im 109/126  soft-tissue]
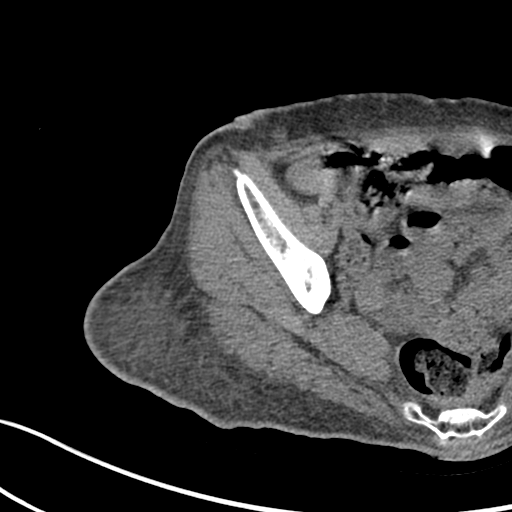
[im 117/126  soft-tissue]
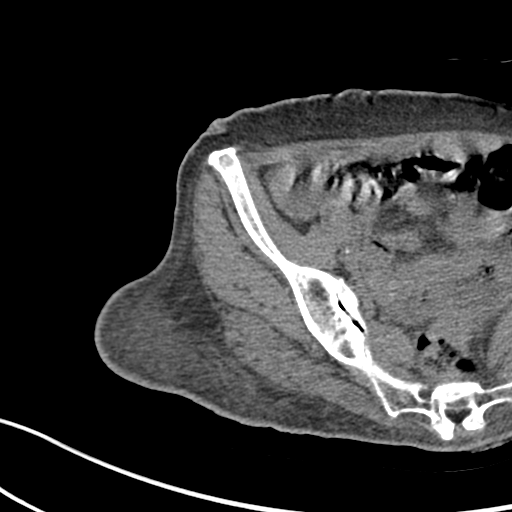

[Series 8: hip 2.0 cor. st · coronal · 0.47mm/px · 3 of 93 slices shown]
[im 31/93  soft-tissue]
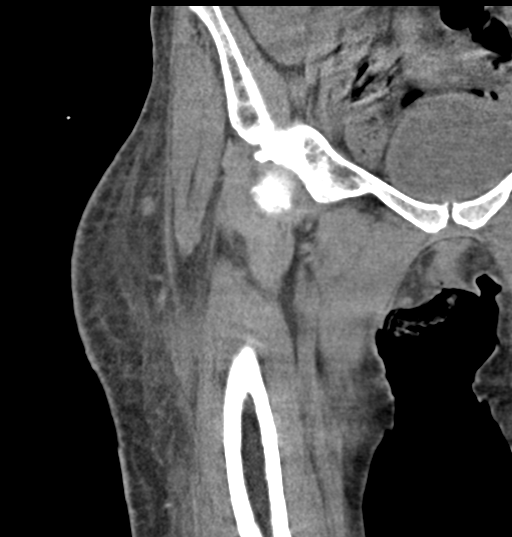
[im 41/93  soft-tissue]
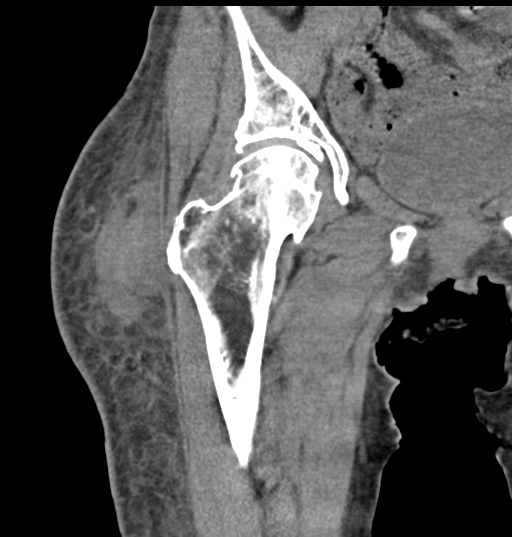
[im 52/93  soft-tissue]
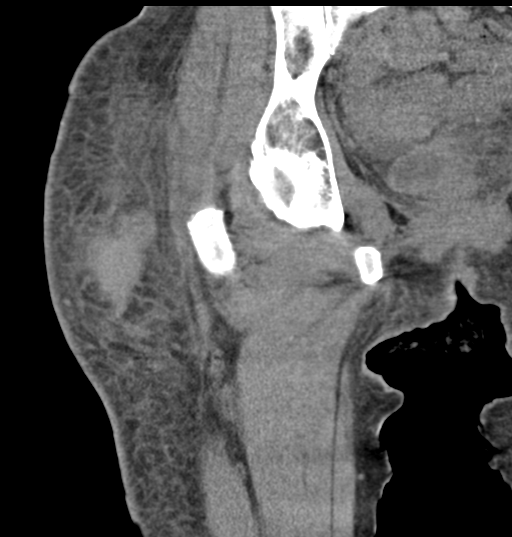

[17 of 46 positions shown; findings below may reference images not displayed]

FINDINGS: Bones/Joint/Cartilage

There is no fracture or dislocation. There is moderately severe
arthritis of the right hip joint with marginal osteophyte formation
and cystic degenerative changes in the femoral head. No joint
effusion.

Muscles and Tendons

No acute abnormalities.

Soft tissues

There is a 7.0 x 3.6 x 2.7 cm hematoma in the subcutaneous fat just
lateral to the left greater trochanter with soft tissue stranding
around the hematoma consistent with bruising. There is also
ill-defined soft tissue stranding adjacent to the posterior aspect
of the left ischial tuberosity which could represent soft tissue
contusion.
IMPRESSION: 1. No acute osseous abnormality of the right hip.
2. Subcutaneous hematoma just lateral to the left greater
trochanter.
3. Moderately severe arthritis of the right hip joint.
